# Patient Record
Sex: Male | Born: 1955 | Race: White | Hispanic: No | Marital: Married | Smoking: Former smoker
Health system: Southern US, Community
[De-identification: ages and names within clinical notes are randomized; demographics above are authoritative.]

## PROBLEM LIST (undated history)

## (undated) DIAGNOSIS — E119 Type 2 diabetes mellitus without complications: Secondary | ICD-10-CM

---

## 2012-12-31 ENCOUNTER — Encounter (HOSPITAL_BASED_OUTPATIENT_CLINIC_OR_DEPARTMENT_OTHER): Payer: Self-pay | Admitting: Emergency Medicine

## 2012-12-31 ENCOUNTER — Emergency Department (HOSPITAL_BASED_OUTPATIENT_CLINIC_OR_DEPARTMENT_OTHER)
Admission: EM | Admit: 2012-12-31 | Discharge: 2012-12-31 | Disposition: A | Discharge status: Home / Self Care | Payer: Worker's Compensation | Attending: Emergency Medicine | Admitting: Emergency Medicine

## 2012-12-31 DIAGNOSIS — Y939 Activity, unspecified: Secondary | ICD-10-CM | POA: Insufficient documentation

## 2012-12-31 DIAGNOSIS — Z79899 Other long term (current) drug therapy: Secondary | ICD-10-CM | POA: Insufficient documentation

## 2012-12-31 DIAGNOSIS — Y929 Unspecified place or not applicable: Secondary | ICD-10-CM | POA: Insufficient documentation

## 2012-12-31 DIAGNOSIS — T22019A Burn of unspecified degree of unspecified forearm, initial encounter: Secondary | ICD-10-CM | POA: Insufficient documentation

## 2012-12-31 DIAGNOSIS — IMO0002 Reserved for concepts with insufficient information to code with codable children: Secondary | ICD-10-CM | POA: Insufficient documentation

## 2012-12-31 DIAGNOSIS — Z87891 Personal history of nicotine dependence: Secondary | ICD-10-CM | POA: Insufficient documentation

## 2012-12-31 DIAGNOSIS — E119 Type 2 diabetes mellitus without complications: Secondary | ICD-10-CM | POA: Insufficient documentation

## 2012-12-31 DIAGNOSIS — T22419A Corrosion of unspecified degree of unspecified forearm, initial encounter: Secondary | ICD-10-CM

## 2012-12-31 HISTORY — DX: Type 2 diabetes mellitus without complications: E11.9

## 2012-12-31 MED ORDER — HYDROCODONE-ACETAMINOPHEN 5-325 MG PO TABS: 2.0000 | ORAL_TABLET | Freq: Four times a day (QID) | ORAL | Status: DC | PRN

## 2012-12-31 MED ORDER — SILVER SULFADIAZINE 1 % EX CREA
TOPICAL_CREAM | Freq: Once | CUTANEOUS | Status: AC
  Administered 2012-12-31: 1 via TOPICAL
  Filled 2012-12-31: qty 85

## 2012-12-31 NOTE — ED Notes (Signed)
Wound care complete with pat drying water from arm, then application of Silvadene cream, then non-adherant pads wrapped with kerlix, secured with tape. Ready for discharge.

## 2012-12-31 NOTE — ED Notes (Signed)
Burn to right arm from "cap100" cleaning product x 1.5 hours ago. Works for [employer].

## 2012-12-31 NOTE — ED Notes (Signed)
Patient sitting at the sink rinsing his arm under cool water.

## 2012-12-31 NOTE — ED Provider Notes (Addendum)
CSN: [id number]     Arrival date & time 12/31/12  1804 History  This chart was scribed for Aviyah Swetz B. Bernette Mayers, MD by Leone Payor, ED Scribe. This patient was seen in room MH09/MH09 and the patient's care was started 6:21 PM.    Chief Complaint  Patient presents with  . Burn    The history is provided by the patient. No language interpreter was used.    HPI Comments: Stanley Hines is a 57 y.o. male who presents to the Emergency Department complaining of a chemical burn to the right forearm that occurred about 2 hours ago. Pt states he was wearing a shirt and got a cleaning product called CIP 100 on it. Pt states he washed the area and applied a burn cream without relief. He denies any other symptoms.    Past Medical History  Diagnosis Date  . Diabetes mellitus without complication    History reviewed. No pertinent past surgical history. No family history on file. History  Substance Use Topics  . Smoking status: Former Games developer  . Smokeless tobacco: Never Used  . Alcohol Use: No    Review of Systems A complete 10 system review of systems was obtained and all systems are negative except as noted in the HPI and PMH.   Allergies  Review of patient's allergies indicates no known allergies.  Home Medications   Current Outpatient Rx  Name  Route  Sig  Dispense  Refill  . metFORMIN (GLUCOPHAGE) 500 MG tablet   Oral   Take 500 mg by mouth daily with breakfast.          BP 147/98  Pulse 82  Temp(Src) 98.6 F (37 C) (Oral)  Resp 18  Ht 5\' 9"  (1.753 m)  Wt 192 lb (87.091 kg)  BMI 28.34 kg/m2  SpO2 99% Physical Exam  Nursing note and vitals reviewed. Constitutional: He is oriented to person, place, and time. He appears well-developed and well-nourished.  HENT:  Head: Normocephalic and atraumatic.  Eyes: EOM are normal. Pupils are equal, round, and reactive to light.  Neck: Normal range of motion. Neck supple.  Cardiovascular: Normal rate, normal heart sounds and intact  distal pulses.   Pulmonary/Chest: Effort normal and breath sounds normal.  Abdominal: Bowel sounds are normal. He exhibits no distension. There is no tenderness.  Musculoskeletal: Normal range of motion. He exhibits tenderness. He exhibits no edema.  Neurological: He is alert and oriented to person, place, and time. He has normal strength. No cranial nerve deficit or sensory deficit.  Skin: Skin is warm and dry. No rash noted.  Approximately 1% TBSA chemical burn to right forearm. Mostly 1st degree ?some 3rd degree centrally due to lack of tenderness.   Psychiatric: He has a normal mood and affect.    ED Course  Procedures   DIAGNOSTIC STUDIES: Oxygen Saturation is 99% on RA, normal by my interpretation.    COORDINATION OF CARE: 6:18 PM Will have pt run affected area under luke warm water. Discussed treatment plan with pt at bedside and pt agreed to plan.   Labs Review Labs Reviewed - No data to display Imaging Review No results found.  EKG Interpretation   None       MDM   1. Chemical burn of forearm, initial encounter     Initial exposure about 2 hours ago, however unclear if he had adequate irrigation. He had arm under running tap water for about 2-3 minutes here and pH check then was still showing pH  8. Irrigated for an additional 20-65min and now showing neutral pH. Silvadene dressing for burn care. Close PCP followup. Pain medications if needed. No evidence now of liquefaction.   I personally performed the services described in this documentation, which was scribed in my presence. The recorded information has been reviewed and is accurate.      Marquise Wicke B. Bernette Mayers, MD 12/31/12 1920  Bonnita Levan. Bernette Mayers, MD 01/08/13 [phone]

## 2018-02-17 ENCOUNTER — Emergency Department (HOSPITAL_BASED_OUTPATIENT_CLINIC_OR_DEPARTMENT_OTHER)
Admission: EM | Admit: 2018-02-17 | Discharge: 2018-02-17 | Disposition: A | Discharge status: Home / Self Care | Attending: Emergency Medicine | Admitting: Emergency Medicine

## 2018-02-17 ENCOUNTER — Emergency Department (HOSPITAL_BASED_OUTPATIENT_CLINIC_OR_DEPARTMENT_OTHER)

## 2018-02-17 ENCOUNTER — Other Ambulatory Visit: Payer: Self-pay

## 2018-02-17 ENCOUNTER — Encounter (HOSPITAL_BASED_OUTPATIENT_CLINIC_OR_DEPARTMENT_OTHER): Payer: Self-pay | Admitting: Emergency Medicine

## 2018-02-17 DIAGNOSIS — S299XXA Unspecified injury of thorax, initial encounter: Secondary | ICD-10-CM | POA: Diagnosis present

## 2018-02-17 DIAGNOSIS — Y9389 Activity, other specified: Secondary | ICD-10-CM | POA: Insufficient documentation

## 2018-02-17 DIAGNOSIS — Y929 Unspecified place or not applicable: Secondary | ICD-10-CM | POA: Insufficient documentation

## 2018-02-17 DIAGNOSIS — E119 Type 2 diabetes mellitus without complications: Secondary | ICD-10-CM | POA: Insufficient documentation

## 2018-02-17 DIAGNOSIS — Z87891 Personal history of nicotine dependence: Secondary | ICD-10-CM | POA: Diagnosis not present

## 2018-02-17 DIAGNOSIS — Z7984 Long term (current) use of oral hypoglycemic drugs: Secondary | ICD-10-CM | POA: Diagnosis not present

## 2018-02-17 DIAGNOSIS — M545 Low back pain, unspecified: Secondary | ICD-10-CM

## 2018-02-17 DIAGNOSIS — W108XXA Fall (on) (from) other stairs and steps, initial encounter: Secondary | ICD-10-CM | POA: Insufficient documentation

## 2018-02-17 DIAGNOSIS — Y990 Civilian activity done for income or pay: Secondary | ICD-10-CM | POA: Insufficient documentation

## 2018-02-17 DIAGNOSIS — R1032 Left lower quadrant pain: Secondary | ICD-10-CM | POA: Diagnosis not present

## 2018-02-17 DIAGNOSIS — S20212A Contusion of left front wall of thorax, initial encounter: Secondary | ICD-10-CM | POA: Insufficient documentation

## 2018-02-17 LAB — BASIC METABOLIC PANEL
Anion gap: 8 (ref 5–15)
BUN: 14 mg/dL (ref 8–23)
CO2: 26 mmol/L (ref 22–32)
CREATININE: 0.85 mg/dL (ref 0.61–1.24)
Calcium: 9.3 mg/dL (ref 8.9–10.3)
Chloride: 103 mmol/L (ref 98–111)
GFR calc non Af Amer: >60 mL/min (ref >60)
Glucose, Bld: 184 mg/dL — ABNORMAL HIGH (ref 70–99)
Potassium: 4.1 mmol/L (ref 3.5–5.1)
Sodium: 137 mmol/L (ref 135–145)

## 2018-02-17 LAB — CBC WITH DIFFERENTIAL/PLATELET
Abs Immature Granulocytes: 0.03 10*3/uL (ref 0.00–0.07)
Basophils Absolute: 0 10*3/uL (ref 0.0–0.1)
Basophils Relative: 1 %
EOS PCT: 1 %
Eosinophils Absolute: 0.1 10*3/uL (ref 0.0–0.5)
HEMATOCRIT: 42.2 % (ref 39.0–52.0)
Hemoglobin: 13.4 g/dL (ref 13.0–17.0)
Immature Granulocytes: 0 %
LYMPHS PCT: 30 %
Lymphs Abs: 2.4 10*3/uL (ref 0.7–4.0)
MCH: 27.2 pg (ref 26.0–34.0)
MCHC: 31.8 g/dL (ref 30.0–36.0)
MCV: 85.8 fL (ref 80.0–100.0)
MONOS PCT: 7 %
Monocytes Absolute: 0.5 10*3/uL (ref 0.1–1.0)
Neutro Abs: 5 10*3/uL (ref 1.7–7.7)
Neutrophils Relative %: 61 %
Platelets: 250 10*3/uL (ref 150–400)
RBC: 4.92 MIL/uL (ref 4.22–5.81)
RDW: 12.9 % (ref 11.5–15.5)
WBC: 8.1 10*3/uL (ref 4.0–10.5)
nRBC: 0 % (ref 0.0–0.2)

## 2018-02-17 MED ORDER — OXYCODONE-ACETAMINOPHEN 5-325 MG PO TABS
1.0000 | ORAL_TABLET | Freq: Once | ORAL | Status: AC
  Administered 2018-02-17: 1 via ORAL
  Filled 2018-02-17: qty 1

## 2018-02-17 MED ORDER — HYDROCODONE-ACETAMINOPHEN 5-325 MG PO TABS: 1.0000 | ORAL_TABLET | Freq: Four times a day (QID) | ORAL | 0 refills | Status: AC | PRN

## 2018-02-17 MED ORDER — CYCLOBENZAPRINE HCL 10 MG PO TABS: 10.0000 mg | ORAL_TABLET | Freq: Every evening | ORAL | 0 refills | Status: AC | PRN

## 2018-02-17 MED ORDER — IOPAMIDOL (ISOVUE-300) INJECTION 61%
100.0000 mL | Freq: Once | INTRAVENOUS | Status: AC | PRN
  Administered 2018-02-17: 100 mL via INTRAVENOUS

## 2018-02-17 NOTE — ED Provider Notes (Signed)
[facility] EMERGENCY DEPARTMENT Provider Note   CSN: [id number] Arrival date & time: 02/17/18  1407     History   Chief Complaint Chief Complaint  Patient presents with  . Back Injury    HPI Stanley Hines is a 62 y.o. male with past medical history of diabetes, presenting to the emergency department with complaint of left low back and left flank pain after mechanical fall that occurred around 11:30 AM this morning.  States he was at work and going down steps.  He states he was 3-4 steps from the bottom when his foot slipped, falling onto his left side.  He did not hit his head or pass out.  He has had persistent pain to the left flank/posterior lower ribs and left low back in the fall.  Pain is significantly worse with any movement, deep breathing or palpation.  No medications tried prior to arrival.  Not on anticoagulation.  States he initially felt some numbness sensation to his left leg however that is resolving.  He also bumped his left hip/buttock area and left elbow however that pain is minimal compared to his back.  Denies abdominal pain, shortness of breath, weakness in extremities.  The history is provided by the patient.    Past Medical History:  Diagnosis Date  . Diabetes mellitus without complication (HCC)     There are no active problems to display for this patient.   History reviewed. No pertinent surgical history.      Home Medications    Prior to Admission medications   Medication Sig Start Date End Date Taking? Authorizing Provider  cyclobenzaprine (FLEXERIL) 10 MG tablet Take 1 tablet (10 mg total) by mouth at bedtime as needed for muscle spasms. 02/17/18   Noha Karasik, [place], PA-C  HYDROcodone-acetaminophen (NORCO/VICODIN) 5-325 MG tablet Take 1-2 tablets by mouth every 6 (six) hours as needed. 02/17/18   Jacquees Gongora, [place] N, PA-C  metFORMIN (GLUCOPHAGE) 500 MG tablet Take 500 mg by mouth daily with breakfast.    [provider]     Family History History reviewed. No pertinent family history.  Social History Social History   Tobacco Use  . Smoking status: Former Games developer  . Smokeless tobacco: Never Used  Substance Use Topics  . Alcohol use: No  . Drug use: No     Allergies   Patient has no known allergies.   Review of Systems Review of Systems  Gastrointestinal: Negative for abdominal pain.  Musculoskeletal: Positive for arthralgias, back pain and myalgias.  Skin: Positive for color change.  Neurological: Positive for numbness (resolving). Negative for syncope, weakness and headaches.  Hematological: Does not bruise/bleed easily.  All other systems reviewed and are negative.    Physical Exam Updated Vital Signs BP 130/80 (BP Location: Right Arm)   Pulse (!) 57   Temp 97.8 F (36.6 C) (Oral)   Resp 16   Ht 5\' 9"  (1.753 m)   Wt 88.5 kg   SpO2 96%   BMI 28.80 kg/m   Physical Exam Vitals signs and nursing note reviewed.  Constitutional:      Appearance: He is well-developed.  HENT:     Head: Normocephalic and atraumatic.  Eyes:     Conjunctiva/sclera: Conjunctivae normal.  Neck:     Musculoskeletal: Normal range of motion.  Cardiovascular:     Rate and Rhythm: Normal rate and regular rhythm.     Pulses: Normal pulses.  Pulmonary:     Effort: Pulmonary effort is normal. No respiratory  distress.     Breath sounds: Normal breath sounds. No wheezing, rhonchi or rales.  Abdominal:     Palpations: Abdomen is soft.     Tenderness: There is no abdominal tenderness. There is no guarding.  Musculoskeletal:     Comments: There is ecchymosis to the left posterior flank/distal portion of the posterior chest wall associated tenderness.  No crepitus.  There is tenderness to the left lumbar paraspinal musculature and generalized tenderness to the lower L-spine.  No bony step-offs or gross deformities.  There is no bruising to the spine.  This is stable.  Hips with normal range of motion. Just  distal to left elbow there is small superficial abrasions with associated mild tenderness.  Elbow with full normal range of motion without pain.  No edema or deformity.  Skin:    General: Skin is warm.  Neurological:     General: No focal deficit present.     Mental Status: He is alert.     Comments: Limited effort with strength testing of the lower extremities as patient reports pain in the back with this.  He does have equal dorsi and plantar flexion.  Normal sensation.  Psychiatric:        Behavior: Behavior normal.      ED Treatments / Results  Labs (all labs ordered are listed, but only abnormal results are displayed) Labs Reviewed  BASIC METABOLIC PANEL - Abnormal; Notable for the following components:      Result Value   Glucose, Bld 184 (*)    All other components within normal limits  CBC WITH DIFFERENTIAL/PLATELET    EKG None  Radiology Dg Ribs Unilateral W/chest Left  Result Date: 02/17/2018 CLINICAL DATA:  Larey SeatFell at work today injuring LEFT ribs EXAM: LEFT RIBS AND CHEST - 3+ VIEW COMPARISON:  None FINDINGS: Upper normal heart size. Mediastinal contours and pulmonary vascularity normal. Peribronchial thickening and mild diffuse accentuation of interstitial markings of uncertain chronicity. No segmental infiltrate, pleural effusion or pneumothorax. Osseous mineralization grossly normal. BB placed at site of symptoms lower LEFT chest. Irregular linear lucency traverses the posterior LEFT eleventh rib consistent with nondisplaced fracture, with finding also identified on lumbar spine exam. No other definite osseous abnormality seen. IMPRESSION: Bronchitic interstitial changes of uncertain acuity. Nondisplaced fracture posterior LEFT eleventh rib. Electronically Signed   By: Ulyses SouthwardMark  Boles M.D.   On: 02/17/2018 15:03   Dg Lumbar Spine Complete  Result Date: 02/17/2018 CLINICAL DATA:  LEFT lower back pain radiating down LEFT leg EXAM: LUMBAR SPINE - COMPLETE 4+ VIEW COMPARISON:   None FINDINGS: Five non-rib-bearing lumbar vertebra. Minimal dextroconvex scoliosis. Osseous mineralization normal. Minimal disc space narrowing L4-L5. Vertebral body and disc space heights otherwise maintained. Tiny endplate spurs L3-L4 and L4-L5. No fracture, subluxation or bone destruction. No spondylolysis. Incidentally noted nondisplaced fracture of the posterior LEFT eleventh rib. IMPRESSION: Nondisplaced fracture posterior LEFT eleventh rib. Minimal degenerative disc disease changes lumbar spine without acute lumbar spine abnormality. Electronically Signed   By: Ulyses SouthwardMark  Boles M.D.   On: 02/17/2018 15:06   Ct Abdomen Pelvis W Contrast  Result Date: 02/17/2018 CLINICAL DATA:  Status post fall on stairs this morning. Lower back pain and numbness on the left side. Left eleventh rib fracture on plain films this same day. Initial encounter. EXAM: CT ABDOMEN AND PELVIS WITH CONTRAST TECHNIQUE: Multidetector CT imaging of the abdomen and pelvis was performed using the standard protocol following bolus administration of intravenous contrast. CONTRAST:  100 ML ISOVUE-300 IOPAMIDOL (ISOVUE-300) INJECTION  61% COMPARISON:  None. FINDINGS: Lower chest: Minimal dependent atelectasis is noted. No pleural or pericardial effusion. Hepatobiliary: The liver is diffusely low attenuating consistent with fatty infiltration. No focal lesion is identified. Gallbladder and biliary tree appear normal. Pancreas: Unremarkable. No pancreatic ductal dilatation or surrounding inflammatory changes. Spleen: No splenic injury or perisplenic hematoma.  Normal in size. Adrenals/Urinary Tract: Adrenal glands are unremarkable. Kidneys are normal, without renal calculi, focal lesion, or hydronephrosis. Bladder is unremarkable. Stomach/Bowel: Stomach is within normal limits. Appendix appears normal. No evidence of bowel wall thickening, distention, or inflammatory changes. Vascular/Lymphatic: Aortic atherosclerosis. No enlarged abdominal or  pelvic lymph nodes. Reproductive: Prostate is unremarkable. Other: None. Musculoskeletal: There is no fracture. Specifically, the left eleventh rib is well seen and no fracture is present. Finding on plain film is due to overlapping shadows. IMPRESSION: No acute abnormality abdomen or pelvis. Negative for left eleventh rib fracture. Finding on plain films is due to overlapping shadows. Fatty infiltration of the liver. Atherosclerosis. Electronically Signed   By: Drusilla Kanner M.D.   On: 02/17/2018 17:30    Procedures Procedures (including critical care time)  Medications Ordered in ED Medications  oxyCODONE-acetaminophen (PERCOCET/ROXICET) 5-325 MG per tablet 1 tablet (1 tablet Oral Given 02/17/18 1500)  iopamidol (ISOVUE-300) 61 % injection 100 mL (100 mLs Intravenous Contrast Given 02/17/18 1657)     Initial Impression / Assessment and Plan / ED Course  I have reviewed the triage vital signs and the nursing notes.  Pertinent labs & imaging results that were available during my care of the patient were reviewed by me and considered in my medical decision making (see chart for details).     Patient presenting after mechanical fall down 3-4 steps at work today.  All onto left side, he has left flank and left lower back pain that is worse with movement and palpation.  On exam, he has associated ecchymosis to the left posterior lower rib cage with tenderness.  No crepitus.  Lungs are clear.  No neuro deficits.  Abdomen is soft and nontender.  X-ray showing evidence of left nondisplaced posterior rib fracture, L-spine imaging is negative.  On reevaluation, patient continues to report pain.  Patient evaluated by Dr. Pilar Plate, who recommends CT scan.  Labs obtained for imaging are reassuring.  CT is negative for acute intra-abdominal injury.  It is also negative for rib fracture, suggesting there there was shadowing to suggest fracture on x-ray. Patient reevaluated, discussed reassuring imaging results.   He is agreeable to plan with discharge with symptomatic management and PCP follow-up.  Discussed return precautions.  He is well-appearing and safe for discharge.  [place] Controlled Substance reporting System queried  Discussed results, findings, treatment and follow up. Patient advised of return precautions. Patient verbalized understanding and agreed with plan.  Final Clinical Impressions(s) / ED Diagnoses   Final diagnoses:  Rib contusion, left, initial encounter  Fall (on) (from) other stairs and steps, initial encounter  Acute left-sided low back pain without sciatica    ED Discharge Orders         Ordered    HYDROcodone-acetaminophen (NORCO/VICODIN) 5-325 MG tablet  Every 6 hours PRN     02/17/18 1757    cyclobenzaprine (FLEXERIL) 10 MG tablet  At bedtime PRN     02/17/18 1757           Aleah Ahlgrim, [place], PA-C 02/17/18 1806    Sabas Sous, MD 02/17/18 2342

## 2018-02-17 NOTE — ED Notes (Signed)
CT will need to wait for Labs, per radiology protocol, pt with hx DM

## 2018-02-17 NOTE — Discharge Instructions (Signed)
Please read instructions below. Apply ice to your areas of pain for 20 minutes at a time. You can take hydrocodone every 6 hours as needed for severe pain.  Be aware this medication can make you drowsy.  Do not drive, drink alcohol, or take Tylenol with this medication. You can take over-the-counter pain medications as needed for mild to moderate pain. You can take flexeril every 12 hours as needed for muscle spasm. Be aware this medication can make you drowsy. Schedule an appointment with your primary care provider to follow up on your visit today. Return to the ER for severely worsening headache, vision changes, if new numbness or tingling in your arms or legs, inability to urinate, inability to hold your bowels, or weakness in your extremities.

## 2018-02-17 NOTE — ED Notes (Signed)
ED Provider at bedside. 

## 2018-02-17 NOTE — ED Triage Notes (Signed)
Reports fall off of stairs at work.  Left flank pain with bruising

## 2019-04-13 IMAGING — CT CT ABD-PELV W/ CM
2 of 5 series · 16 of 46 positions shown, 18 images · IV contrast (APPLIED)
Comparison: None.

CLINICAL DATA: Status post fall on stairs this morning. Lower back
pain and numbness on the left side. Left eleventh rib fracture on
plain films this same day. Initial encounter.

EXAM:
CT ABDOMEN AND PELVIS WITH CONTRAST
TECHNIQUE: Multidetector CT imaging of the abdomen and pelvis was performed
using the standard protocol following bolus administration of
intravenous contrast.
CONTRAST:  100 ML [id number]-9UU IOPAMIDOL (WSJ0KS-9UU) INJECTION 61%

[Series 2: axial st · axial · 0.77mm/px · z∈[-374,+46]mm · 13 of 96 slices shown, 15 images]
[im 6/96  soft-tissue]
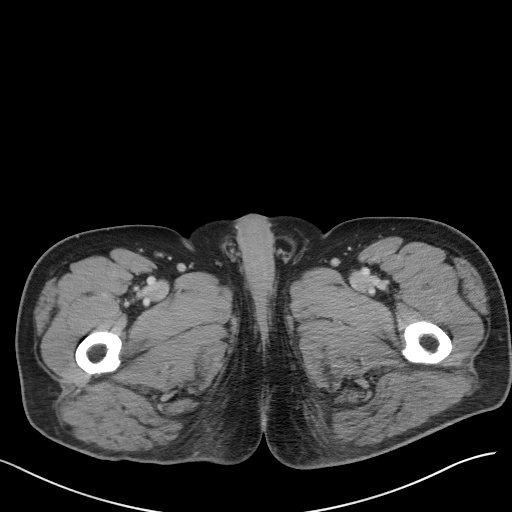
[im 6/96  bone]
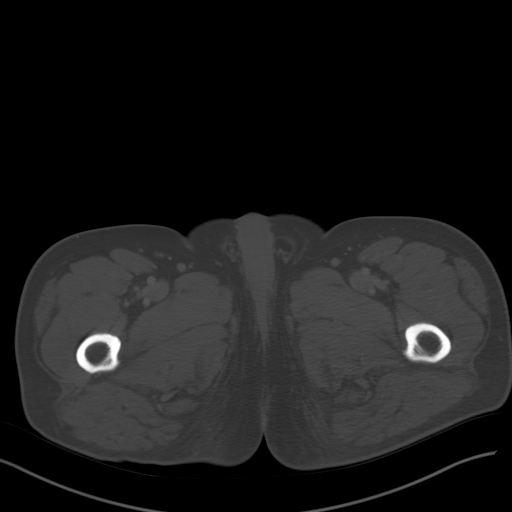
[im 11/96  soft-tissue]
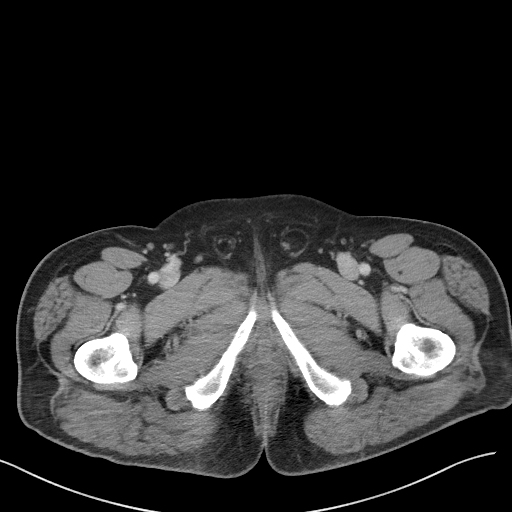
[im 22/96  soft-tissue]
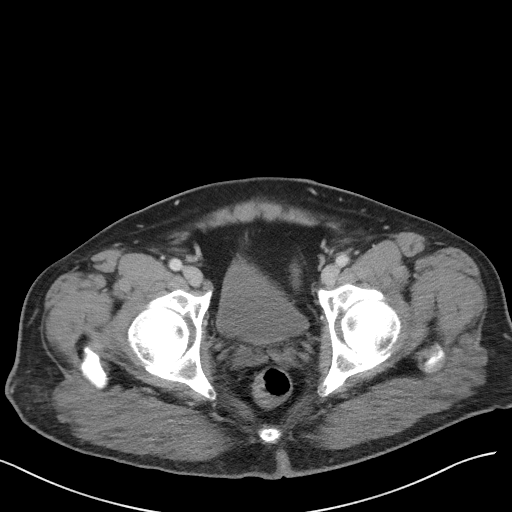
[im 27/96  soft-tissue]
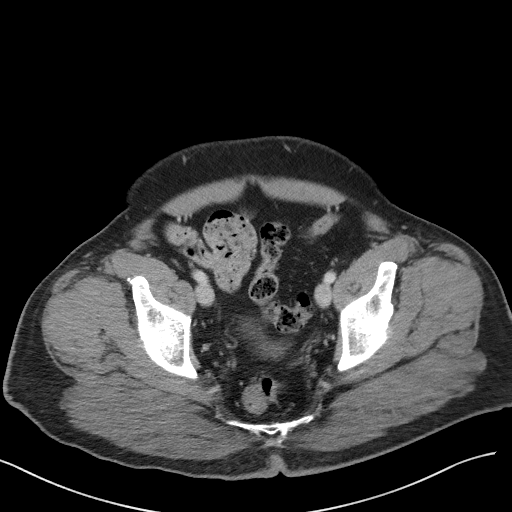
[im 32/96  soft-tissue]
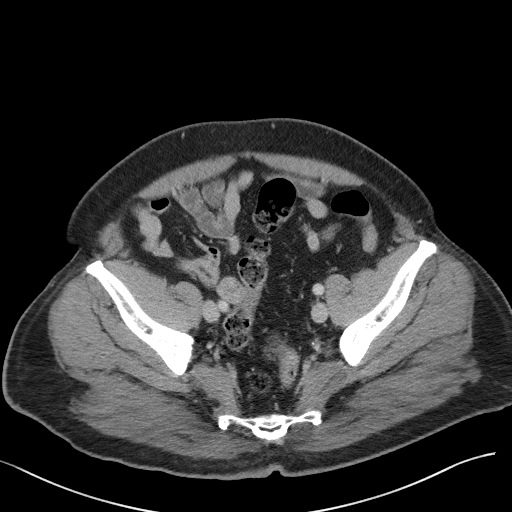
[im 43/96  soft-tissue]
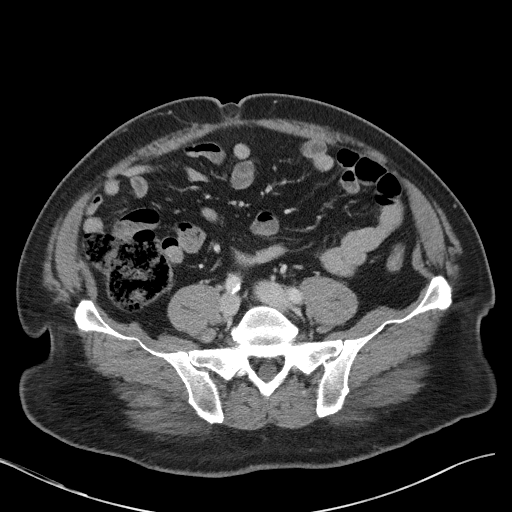
[im 48/96  soft-tissue]
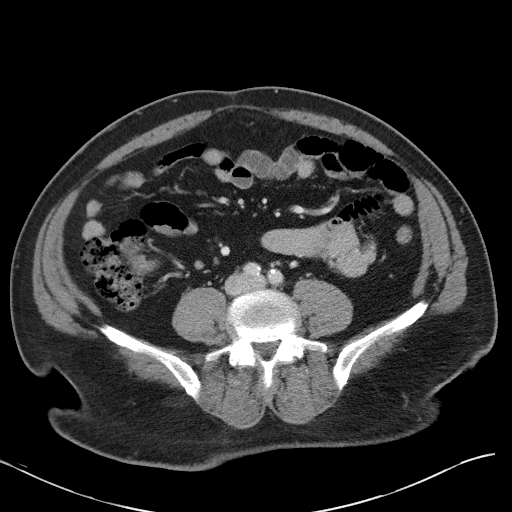
[im 53/96  soft-tissue]
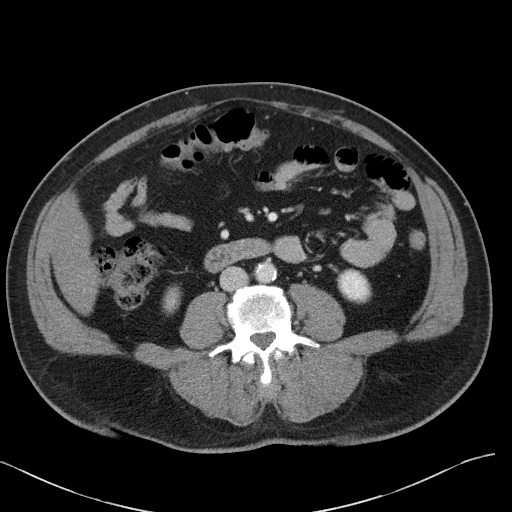
[im 64/96  soft-tissue]
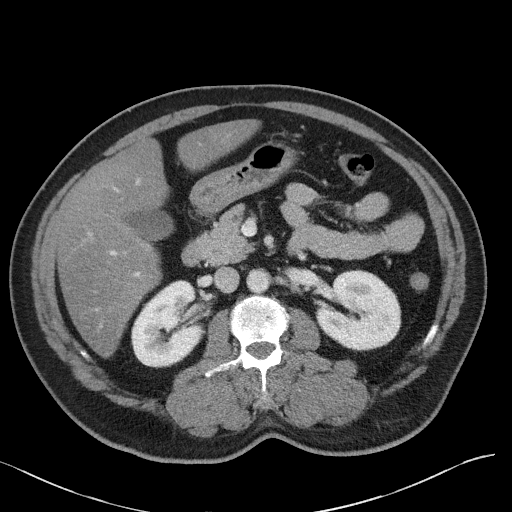
[im 64/96  bone]
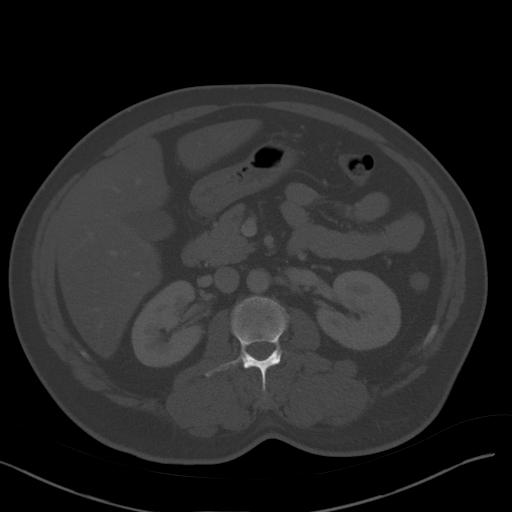
[im 69/96  soft-tissue]
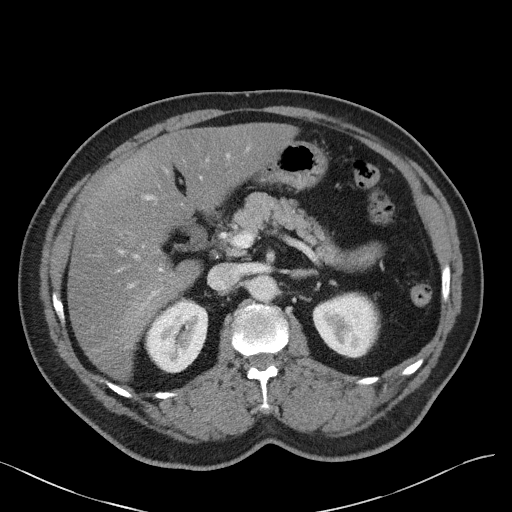
[im 74/96  soft-tissue]
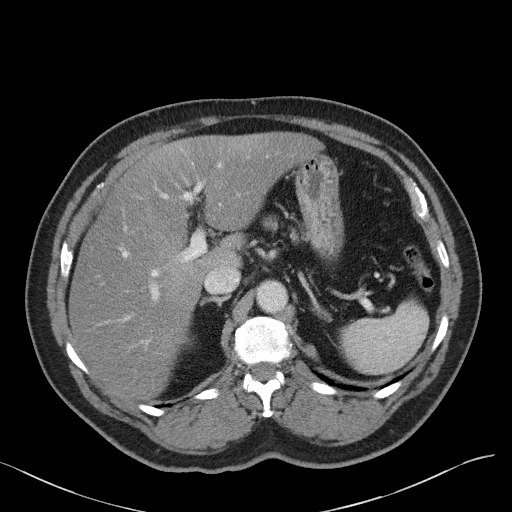
[im 85/96  soft-tissue]
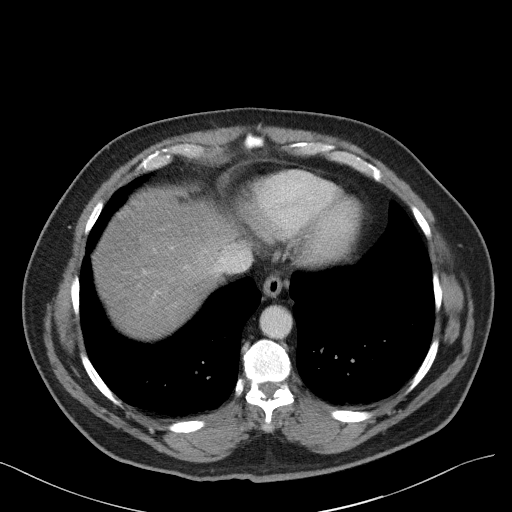
[im 90/96  soft-tissue]
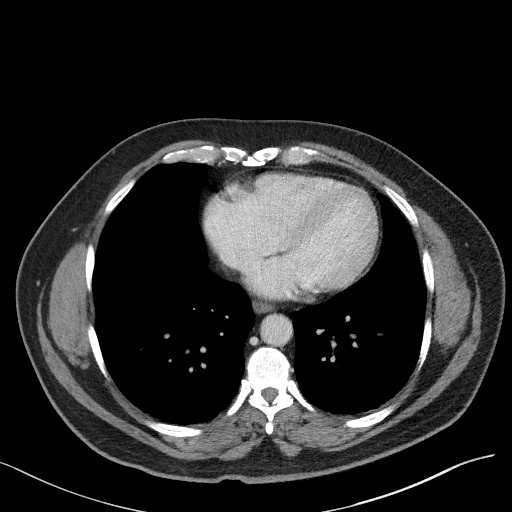

[Series 5: coronal st · coronal · 0.82mm/px · 3 of 95 slices shown]
[im 32/95  soft-tissue]
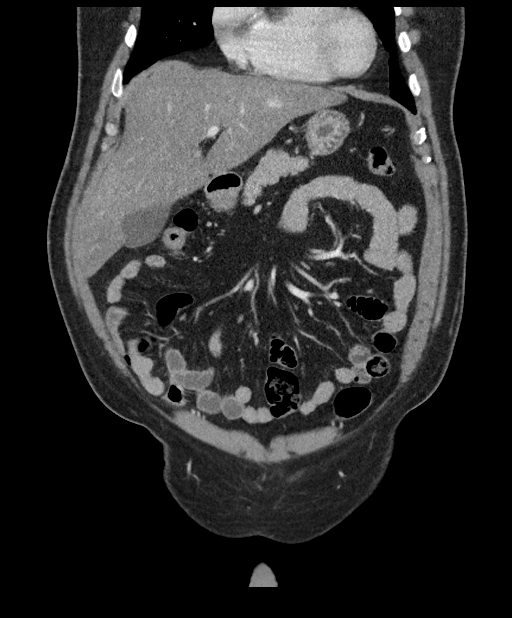
[im 42/95  soft-tissue]
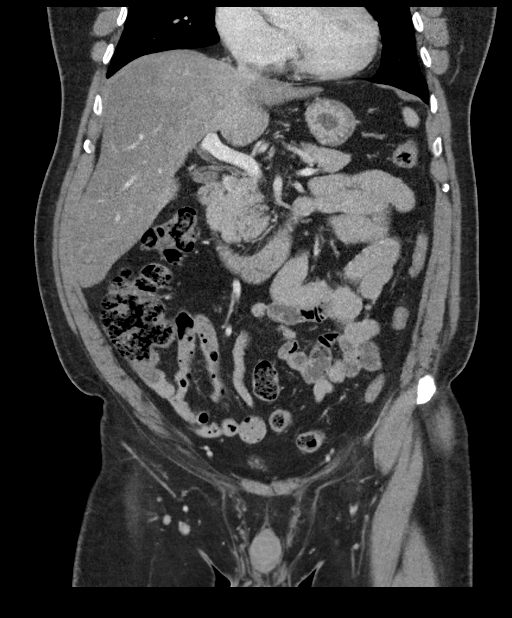
[im 53/95  soft-tissue]
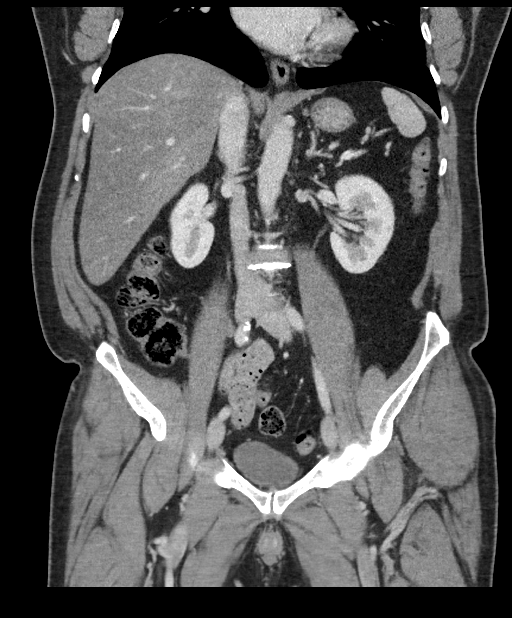

[16 of 46 positions shown; findings below may reference images not displayed]

FINDINGS: Lower chest: Minimal dependent atelectasis is noted. No pleural or
pericardial effusion.

Hepatobiliary: The liver is diffusely low attenuating consistent
with fatty infiltration. No focal lesion is identified. Gallbladder
and biliary tree appear normal.

Pancreas: Unremarkable. No pancreatic ductal dilatation or
surrounding inflammatory changes.

Spleen: No splenic injury or perisplenic hematoma.  Normal in size.

Adrenals/Urinary Tract: Adrenal glands are unremarkable. Kidneys are
normal, without renal calculi, focal lesion, or hydronephrosis.
Bladder is unremarkable.

Stomach/Bowel: Stomach is within normal limits. Appendix appears
normal. No evidence of bowel wall thickening, distention, or
inflammatory changes.

Vascular/Lymphatic: Aortic atherosclerosis. No enlarged abdominal or
pelvic lymph nodes.

Reproductive: Prostate is unremarkable.

Other: None.

Musculoskeletal: There is no fracture. Specifically, the left
eleventh rib is well seen and no fracture is present. Finding on
plain film is due to overlapping shadows.
IMPRESSION: No acute abnormality abdomen or pelvis. Negative for left eleventh
rib fracture. Finding on plain films is due to overlapping shadows.

Fatty infiltration of the liver.

Atherosclerosis.
